# Patient Record
Sex: Male | Born: 1990 | Race: White | Hispanic: No | Smoking: Current every day smoker
Health system: Southern US, Community
[De-identification: ages and names within clinical notes are randomized; demographics above are authoritative.]

## PROBLEM LIST (undated history)

## (undated) DIAGNOSIS — S8290XA Unspecified fracture of unspecified lower leg, initial encounter for closed fracture: Secondary | ICD-10-CM

## (undated) HISTORY — PX: OTHER SURGICAL HISTORY: SHX169

## (undated) HISTORY — PX: WISDOM TOOTH EXTRACTION: SHX21

---
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
## 2010-02-02 NOTE — Letter (Signed)
Summary: Nadara Eaton letter  [facility] at [facility]  [address] [place], [place] [address]   Phone: [phone]  Fax: [phone]       08/09/2009 MRN: [id number]  DELANTE KARAPETYAN [address] [place], [place]  [address]  Dear Mr. OLESON,  [facility] and [facility] announce the retirement of Arta Silence, M.D., from full-time practice at the [facility] office effective July 01, 2009 and his plans of returning part-time.  It is important to Dr. Hetty Ely and to our practice that you understand that [facility] has seven physicians in our office for your health care needs.  We will continue to offer the same exceptional care that you have today.    Dr. Hetty Ely has spoken to many of you about his plans for retirement and returning part-time in the fall.   We will continue to work with you through the transition to schedule appointments for you in the office and meet the high standards that [facility] is committed to.   Again, it is with great pleasure that we share the news that Dr. Hetty Ely will return to [employer] at [facility] in October of 2011 with a reduced schedule.    If you have any questions, or would like to request an appointment with one of our physicians, please call us at [phone] and press the option for Scheduling an appointment.  We take pleasure in providing you with excellent patient care and look forward to seeing you at your next office visit.  Our [facility] Physicians are:  Tillman Abide, M.D. Laurita Quint, M.D. Roxy Manns, M.D. Kerby Nora, M.D. Hannah Beat, M.D. Ruthe Mannan, M.D. We proudly welcomed Raechel Ache, M.D. and Eustaquio Boyden, M.D. to the practice in July/August 2011.  Sincerely,  [facility]

## 2011-02-02 ENCOUNTER — Inpatient Hospital Stay: Payer: Self-pay | Admitting: Orthopedic Surgery

## 2011-02-02 LAB — CBC
HCT: 43.1 % (ref 40.0–52.0)
MCHC: 33.6 g/dL (ref 32.0–36.0)
MCV: 85 fL (ref 80–100)
Platelet: 287 10*3/uL (ref 150–440)
RBC: 5.05 10*6/uL (ref 4.40–5.90)
RDW: 14.2 % (ref 11.5–14.5)
WBC: 9.2 10*3/uL (ref 3.8–10.6)

## 2011-02-02 LAB — URINALYSIS, COMPLETE
Bacteria: NONE SEEN
Glucose,UR: NEGATIVE mg/dL (ref 0–75)
Ketone: NEGATIVE
Ph: 5 (ref 4.5–8.0)
Specific Gravity: 1.01 (ref 1.003–1.030)
WBC UR: 1 /HPF (ref 0–5)

## 2011-02-02 LAB — COMPREHENSIVE METABOLIC PANEL
Albumin: 4.1 g/dL (ref 3.4–5.0)
BUN: 14 mg/dL (ref 7–18)
Bilirubin,Total: 0.4 mg/dL (ref 0.2–1.0)
Chloride: 104 mmol/L (ref 98–107)
Creatinine: 0.85 mg/dL (ref 0.60–1.30)
EGFR (African American): >60
Glucose: 84 mg/dL (ref 65–99)
SGOT(AST): 20 U/L (ref 15–37)
SGPT (ALT): 24 U/L
Sodium: 141 mmol/L (ref 136–145)
Total Protein: 7.8 g/dL (ref 6.4–8.2)

## 2011-02-02 LAB — PROTIME-INR: INR: 1

## 2012-11-10 ENCOUNTER — Emergency Department: Payer: Self-pay | Admitting: Internal Medicine

## 2012-12-31 ENCOUNTER — Emergency Department: Payer: Self-pay | Admitting: Emergency Medicine

## 2013-02-28 ENCOUNTER — Emergency Department: Payer: Self-pay | Admitting: Emergency Medicine

## 2013-02-28 LAB — COMPREHENSIVE METABOLIC PANEL
ANION GAP: 9 (ref 7–16)
AST: 12 U/L — AB (ref 15–37)
Albumin: 3.5 g/dL (ref 3.4–5.0)
Alkaline Phosphatase: 80 U/L
BUN: 11 mg/dL (ref 7–18)
Bilirubin,Total: 0.4 mg/dL (ref 0.2–1.0)
CHLORIDE: 104 mmol/L (ref 98–107)
CREATININE: 0.97 mg/dL (ref 0.60–1.30)
Calcium, Total: 9.2 mg/dL (ref 8.5–10.1)
Co2: 26 mmol/L (ref 21–32)
Glucose: 92 mg/dL (ref 65–99)
OSMOLALITY: 277 (ref 275–301)
POTASSIUM: 3.9 mmol/L (ref 3.5–5.1)
SGPT (ALT): 27 U/L (ref 12–78)
Sodium: 139 mmol/L (ref 136–145)
TOTAL PROTEIN: 8.2 g/dL (ref 6.4–8.2)

## 2013-02-28 LAB — URINALYSIS, COMPLETE
Bacteria: NONE SEEN
Bilirubin,UR: NEGATIVE
Blood: NEGATIVE
GLUCOSE, UR: NEGATIVE mg/dL (ref 0–75)
Ketone: NEGATIVE
LEUKOCYTE ESTERASE: NEGATIVE
Nitrite: NEGATIVE
PH: 6 (ref 4.5–8.0)
PROTEIN: NEGATIVE
Specific Gravity: 1.013 (ref 1.003–1.030)
Squamous Epithelial: NONE SEEN
WBC UR: 2 /HPF (ref 0–5)

## 2013-02-28 LAB — CBC WITH DIFFERENTIAL/PLATELET
BASOS ABS: 0.1 10*3/uL (ref 0.0–0.1)
BASOS PCT: 0.7 %
EOS PCT: 1.4 %
Eosinophil #: 0.2 10*3/uL (ref 0.0–0.7)
HCT: 44.8 % (ref 40.0–52.0)
HGB: 14.7 g/dL (ref 13.0–18.0)
LYMPHS ABS: 2.2 10*3/uL (ref 1.0–3.6)
Lymphocyte %: 15 %
MCH: 27.9 pg (ref 26.0–34.0)
MCHC: 32.9 g/dL (ref 32.0–36.0)
MCV: 85 fL (ref 80–100)
MONO ABS: 1.6 x10 3/mm — AB (ref 0.2–1.0)
Monocyte %: 11.2 %
Neutrophil #: 10.3 10*3/uL — ABNORMAL HIGH (ref 1.4–6.5)
Neutrophil %: 71.7 %
PLATELETS: 325 10*3/uL (ref 150–440)
RBC: 5.28 10*6/uL (ref 4.40–5.90)
RDW: 14.6 % — ABNORMAL HIGH (ref 11.5–14.5)
WBC: 14.4 10*3/uL — ABNORMAL HIGH (ref 3.8–10.6)

## 2013-03-01 LAB — MONONUCLEOSIS SCREEN: Mono Test: POSITIVE

## 2013-08-29 ENCOUNTER — Emergency Department (HOSPITAL_COMMUNITY): Payer: No Typology Code available for payment source

## 2013-08-29 ENCOUNTER — Encounter (HOSPITAL_COMMUNITY): Payer: Self-pay | Admitting: Emergency Medicine

## 2013-08-29 ENCOUNTER — Emergency Department (HOSPITAL_COMMUNITY)
Admission: EM | Admit: 2013-08-29 | Discharge: 2013-08-29 | Disposition: A | Discharge status: Home / Self Care | Payer: No Typology Code available for payment source | Attending: Emergency Medicine | Admitting: Emergency Medicine

## 2013-08-29 DIAGNOSIS — IMO0002 Reserved for concepts with insufficient information to code with codable children: Secondary | ICD-10-CM | POA: Insufficient documentation

## 2013-08-29 DIAGNOSIS — S0990XA Unspecified injury of head, initial encounter: Secondary | ICD-10-CM | POA: Insufficient documentation

## 2013-08-29 DIAGNOSIS — S298XXA Other specified injuries of thorax, initial encounter: Secondary | ICD-10-CM | POA: Diagnosis present

## 2013-08-29 DIAGNOSIS — S301XXA Contusion of abdominal wall, initial encounter: Secondary | ICD-10-CM | POA: Diagnosis not present

## 2013-08-29 DIAGNOSIS — S20211A Contusion of right front wall of thorax, initial encounter: Secondary | ICD-10-CM

## 2013-08-29 DIAGNOSIS — S8012XA Contusion of left lower leg, initial encounter: Secondary | ICD-10-CM

## 2013-08-29 DIAGNOSIS — S20219A Contusion of unspecified front wall of thorax, initial encounter: Secondary | ICD-10-CM | POA: Diagnosis not present

## 2013-08-29 DIAGNOSIS — Y92410 Unspecified street and highway as the place of occurrence of the external cause: Secondary | ICD-10-CM | POA: Insufficient documentation

## 2013-08-29 DIAGNOSIS — S8010XA Contusion of unspecified lower leg, initial encounter: Secondary | ICD-10-CM | POA: Diagnosis not present

## 2013-08-29 DIAGNOSIS — Y9389 Activity, other specified: Secondary | ICD-10-CM | POA: Insufficient documentation

## 2013-08-29 HISTORY — DX: Unspecified fracture of unspecified lower leg, initial encounter for closed fracture: S82.90XA

## 2013-08-29 LAB — CBC WITH DIFFERENTIAL/PLATELET
BASOS ABS: 0.1 10*3/uL (ref 0.0–0.1)
BASOS PCT: 1 % (ref 0–1)
Eosinophils Absolute: 0.1 10*3/uL (ref 0.0–0.7)
Eosinophils Relative: 2 % (ref 0–5)
HEMATOCRIT: 44 % (ref 39.0–52.0)
Hemoglobin: 15.2 g/dL (ref 13.0–17.0)
Lymphocytes Relative: 31 % (ref 12–46)
Lymphs Abs: 2.3 10*3/uL (ref 0.7–4.0)
MCH: 28.4 pg (ref 26.0–34.0)
MCHC: 34.5 g/dL (ref 30.0–36.0)
MCV: 82.1 fL (ref 78.0–100.0)
MONO ABS: 0.6 10*3/uL (ref 0.1–1.0)
Monocytes Relative: 8 % (ref 3–12)
NEUTROS ABS: 4.4 10*3/uL (ref 1.7–7.7)
Neutrophils Relative %: 60 % (ref 43–77)
PLATELETS: 266 10*3/uL (ref 150–400)
RBC: 5.36 MIL/uL (ref 4.22–5.81)
RDW: 12.9 % (ref 11.5–15.5)
WBC: 7.4 10*3/uL (ref 4.0–10.5)

## 2013-08-29 LAB — COMPREHENSIVE METABOLIC PANEL
ALBUMIN: 3.9 g/dL (ref 3.5–5.2)
ALT: 16 U/L (ref 0–53)
AST: 17 U/L (ref 0–37)
Alkaline Phosphatase: 68 U/L (ref 39–117)
Anion gap: 18 — ABNORMAL HIGH (ref 5–15)
BUN: 12 mg/dL (ref 6–23)
CHLORIDE: 102 meq/L (ref 96–112)
CO2: 20 mEq/L (ref 19–32)
Calcium: 9.2 mg/dL (ref 8.4–10.5)
Creatinine, Ser: 1.06 mg/dL (ref 0.50–1.35)
GFR calc Af Amer: >90 mL/min (ref >90)
GFR calc non Af Amer: >90 mL/min (ref >90)
Glucose, Bld: 111 mg/dL — ABNORMAL HIGH (ref 70–99)
Potassium: 3.9 mEq/L (ref 3.7–5.3)
SODIUM: 140 meq/L (ref 137–147)
Total Bilirubin: 0.4 mg/dL (ref 0.3–1.2)
Total Protein: 7.5 g/dL (ref 6.0–8.3)

## 2013-08-29 LAB — I-STAT CHEM 8, ED
BUN: 12 mg/dL (ref 6–23)
CHLORIDE: 106 meq/L (ref 96–112)
CREATININE: 1.3 mg/dL (ref 0.50–1.35)
Calcium, Ion: 1.12 mmol/L (ref 1.12–1.23)
Glucose, Bld: 110 mg/dL — ABNORMAL HIGH (ref 70–99)
HCT: 49 % (ref 39.0–52.0)
Hemoglobin: 16.7 g/dL (ref 13.0–17.0)
Potassium: 3.7 mEq/L (ref 3.7–5.3)
SODIUM: 140 meq/L (ref 137–147)
TCO2: 22 mmol/L (ref 0–100)

## 2013-08-29 LAB — TROPONIN I: Troponin I: <0.3 ng/mL (ref <0.30)

## 2013-08-29 MED ORDER — HYDROMORPHONE HCL PF 1 MG/ML IJ SOLN
1.0000 mg | Freq: Once | INTRAMUSCULAR | Status: AC
  Administered 2013-08-29: 1 mg via INTRAVENOUS
  Filled 2013-08-29: qty 1

## 2013-08-29 MED ORDER — HYDROCODONE-ACETAMINOPHEN 5-325 MG PO TABS: 2.0000 | ORAL_TABLET | ORAL | Status: AC | PRN

## 2013-08-29 MED ORDER — ONDANSETRON HCL 4 MG/2ML IJ SOLN
4.0000 mg | Freq: Once | INTRAMUSCULAR | Status: AC
  Administered 2013-08-29: 4 mg via INTRAVENOUS
  Filled 2013-08-29: qty 2

## 2013-08-29 MED ORDER — CYCLOBENZAPRINE HCL 10 MG PO TABS: 10.0000 mg | ORAL_TABLET | Freq: Three times a day (TID) | ORAL | Status: AC | PRN

## 2013-08-29 MED ORDER — IBUPROFEN 800 MG PO TABS: 800.0000 mg | ORAL_TABLET | Freq: Three times a day (TID) | ORAL | Status: AC

## 2013-08-29 MED ORDER — IOHEXOL 300 MG/ML  SOLN
100.0000 mL | Freq: Once | INTRAMUSCULAR | Status: AC | PRN
  Administered 2013-08-29: 100 mL via INTRAVENOUS

## 2013-08-29 NOTE — ED Notes (Signed)
Discussed plan of care with Dr. Blinda Leatherwood, will prepare the patient for discharge.

## 2013-08-29 NOTE — ED Notes (Signed)
Reminded patient that he needs to submit urine sample.
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
## 2013-08-29 NOTE — ED Notes (Signed)
Dr. Pollina at bedside   

## 2013-12-23 ENCOUNTER — Emergency Department: Payer: Self-pay | Admitting: Emergency Medicine
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
## 2014-04-26 NOTE — Op Note (Signed)
PATIENT NAME:  Jimmy Oconnell, Jimmy Oconnell MR#:  [id number] DATE OF BIRTH:  February 25, 1990  DATE OF PROCEDURE:  02/03/2011  PREOPERATIVE DIAGNOSIS: Left distal tibia and fibula fracture.   POSTOPERATIVE DIAGNOSIS: Left distal tibia and fibula fracture.   PROCEDURE: Closed reduction with long-leg casting of left lower leg.   SURGEON: Leitha SchullerMichael J. Jakalyn Kratky, M.D.   ANESTHESIA: General.   DESCRIPTION OF PROCEDURE: The patient was brought to the Operating Room and, after adequate anesthesia was obtained, the C-arm was brought in and alignment checked in AP and lateral projections. There was slight valgus deformity and this could be corrected with slight varus pressure. The distal fragment was slightly posterior and this also could be at least partially corrected. Cast padding was then applied and a short-leg cast placed with the leg in flexion contouring the cast to maintain near anatomic alignment. C-arm was used to check alignment at this point. After the short-leg cast was set, the cast was extended to above the knee with about 35 degrees of knee flexion to maintain rotational stability. After the cast had set, the patient was woken up and sent to the Recovery Room.   C-arm views were obtained during the procedure with permanent postoperative x-ray showing acceptable alignment.  ESTIMATED BLOOD LOSS: None.  COMPLICATIONS: None.  SPECIMENS: None.  ____________________________ Leitha SchullerMichael J. Marti Acebo, MD mjm:[id number] D: 02/03/2011 21:47:08 ET     T: 02/04/2011 11:51:20 ET        JOB#: [id number] Nolon BussingMICHAEL J Wildon Cuevas MD ELECTRONICALLY SIGNED 02/05/2011 7:47
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
## 2015-06-06 ENCOUNTER — Emergency Department
Admission: EM | Admit: 2015-06-06 | Discharge: 2015-06-06 | Disposition: A | Discharge status: Home / Self Care | Payer: Self-pay | Attending: Emergency Medicine | Admitting: Emergency Medicine

## 2015-06-06 ENCOUNTER — Encounter: Payer: Self-pay | Admitting: Emergency Medicine

## 2015-06-06 ENCOUNTER — Emergency Department: Payer: Self-pay

## 2015-06-06 DIAGNOSIS — S0093XA Contusion of unspecified part of head, initial encounter: Secondary | ICD-10-CM

## 2015-06-06 DIAGNOSIS — S060X9A Concussion with loss of consciousness of unspecified duration, initial encounter: Secondary | ICD-10-CM

## 2015-06-06 DIAGNOSIS — S0990XA Unspecified injury of head, initial encounter: Secondary | ICD-10-CM

## 2015-06-06 DIAGNOSIS — Y999 Unspecified external cause status: Secondary | ICD-10-CM | POA: Insufficient documentation

## 2015-06-06 DIAGNOSIS — F17210 Nicotine dependence, cigarettes, uncomplicated: Secondary | ICD-10-CM | POA: Insufficient documentation

## 2015-06-06 DIAGNOSIS — Y929 Unspecified place or not applicable: Secondary | ICD-10-CM | POA: Insufficient documentation

## 2015-06-06 DIAGNOSIS — Y9389 Activity, other specified: Secondary | ICD-10-CM | POA: Insufficient documentation

## 2015-06-06 DIAGNOSIS — F1010 Alcohol abuse, uncomplicated: Secondary | ICD-10-CM

## 2015-06-06 LAB — URINE DRUG SCREEN, QUALITATIVE (ARMC ONLY)
Amphetamines, Ur Screen: NOT DETECTED
BARBITURATES, UR SCREEN: NOT DETECTED
Benzodiazepine, Ur Scrn: NOT DETECTED
COCAINE METABOLITE, UR ~~LOC~~: POSITIVE — AB
Cannabinoid 50 Ng, Ur ~~LOC~~: POSITIVE — AB
MDMA (ECSTASY) UR SCREEN: NOT DETECTED
METHADONE SCREEN, URINE: NOT DETECTED
Opiate, Ur Screen: NOT DETECTED
Phencyclidine (PCP) Ur S: NOT DETECTED
Tricyclic, Ur Screen: NOT DETECTED

## 2015-06-06 LAB — ETHANOL: Alcohol, Ethyl (B): 77 mg/dL — ABNORMAL HIGH (ref <5)
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
## 2015-06-06 NOTE — Discharge Instructions (Signed)
Concussion, Adult °A concussion, or closed-head injury, is a brain injury caused by a direct blow to the head or by a quick and sudden movement (jolt) of the head or neck. Concussions are usually not life-threatening. Even so, the effects of a concussion can be serious. If you have had a concussion before, you are more likely to experience concussion-like symptoms after a direct blow to the head.  °CAUSES °· Direct blow to the head, such as from running into another player during a soccer game, being hit in a fight, or hitting your head on a hard surface. °· A jolt of the head or neck that causes the brain to move back and forth inside the skull, such as in a car crash. °SIGNS AND SYMPTOMS °The signs of a concussion can be hard to notice. Early on, they may be missed by you, family members, and health care providers. You may look fine but act or feel differently. °Symptoms are usually temporary, but they may last for days, weeks, or even longer. Some symptoms may appear right away while others may not show up for hours or days. Every head injury is different. Symptoms include: °· Mild to moderate headaches that will not go away. °· A feeling of pressure inside your head. °· Having more trouble than usual: °¨ Learning or remembering things you have heard. °¨ Answering questions. °¨ Paying attention or concentrating. °¨ Organizing daily tasks. °¨ Making decisions and solving problems. °· Slowness in thinking, acting or reacting, speaking, or reading. °· Getting lost or being easily confused. °· Feeling tired all the time or lacking energy (fatigued). °· Feeling drowsy. °· Sleep disturbances. °¨ Sleeping more than usual. °¨ Sleeping less than usual. °¨ Trouble falling asleep. °¨ Trouble sleeping (insomnia). °· Loss of balance or feeling lightheaded or dizzy. °· Nausea or vomiting. °· Numbness or tingling. °· Increased sensitivity to: °¨ Sounds. °¨ Lights. °¨ Distractions. °· Vision problems or eyes that tire  easily. °· Diminished sense of taste or smell. °· Ringing in the ears. °· Mood changes such as feeling sad or anxious. °· Becoming easily irritated or angry for little or no reason. °· Lack of motivation. °· Seeing or hearing things other people do not see or hear (hallucinations). °DIAGNOSIS °Your health care provider can usually diagnose a concussion based on a description of your injury and symptoms. He or she will ask whether you passed out (lost consciousness) and whether you are having trouble remembering events that happened right before and during your injury. °Your evaluation might include: °· A brain scan to look for signs of injury to the brain. Even if the test shows no injury, you may still have a concussion. °· Blood tests to be sure other problems are not present. °TREATMENT °· Concussions are usually treated in an emergency department, in urgent care, or at a clinic. You may need to stay in the hospital overnight for further treatment. °· Tell your health care provider if you are taking any medicines, including prescription medicines, over-the-counter medicines, and natural remedies. Some medicines, such as blood thinners (anticoagulants) and aspirin, may increase the chance of complications. Also tell your health care provider whether you have had alcohol or are taking illegal drugs. This information may affect treatment. °· Your health care provider will send you home with important instructions to follow. °· How fast you will recover from a concussion depends on many factors. These factors include how severe your concussion is, what part of your brain was injured,   your age, and how healthy you were before the concussion.  Most people with mild injuries recover fully. Recovery can take time. In general, recovery is slower in older persons. Also, persons who have had a concussion in the past or have other medical problems may find that it takes longer to recover from their current injury. HOME  CARE INSTRUCTIONS General Instructions  Carefully follow the directions your health care provider gave you.  Only take over-the-counter or prescription medicines for pain, discomfort, or fever as directed by your health care provider.  Take only those medicines that your health care provider has approved.  Do not drink alcohol until your health care provider says you are well enough to do so. Alcohol and certain other drugs may slow your recovery and can put you at risk of further injury.  If it is harder than usual to remember things, write them down.  If you are easily distracted, try to do one thing at a time. For example, do not try to watch TV while fixing dinner.  Talk with family members or close friends when making important decisions.  Keep all follow-up appointments. Repeated evaluation of your symptoms is recommended for your recovery.  Watch your symptoms and tell others to do the same. Complications sometimes occur after a concussion. Older adults with a brain injury may have a higher risk of serious complications, such as a blood clot on the brain.  Tell your teachers, school nurse, school counselor, coach, athletic trainer, or work Freight forwarder about your injury, symptoms, and restrictions. Tell them about what you can or cannot do. They should watch for:  Increased problems with attention or concentration.  Increased difficulty remembering or learning new information.  Increased time needed to complete tasks or assignments.  Increased irritability or decreased ability to cope with stress.  Increased symptoms.  Rest. Rest helps the brain to heal. Make sure you:  Get plenty of sleep at night. Avoid staying up late at night.  Keep the same bedtime hours on weekends and weekdays.  Rest during the day. Take daytime naps or rest breaks when you feel tired.  Limit activities that require a lot of thought or concentration. These include:  Doing homework or job-related  work.  Watching TV.  Working on the computer.  Avoid any situation where there is potential for another head injury (football, hockey, soccer, basketball, martial arts, downhill snow sports and horseback riding). Your condition will get worse every time you experience a concussion. You should avoid these activities until you are evaluated by the appropriate follow-up health care providers. Returning To Your Regular Activities You will need to return to your normal activities slowly, not all at once. You must give your body and brain enough time for recovery.  Do not return to sports or other athletic activities until your health care provider tells you it is safe to do so.  Ask your health care provider when you can drive, ride a bicycle, or operate heavy machinery. Your ability to react may be slower after a brain injury. Never do these activities if you are dizzy.  Ask your health care provider about when you can return to work or school. Preventing Another Concussion It is very important to avoid another brain injury, especially before you have recovered. In rare cases, another injury can lead to permanent brain damage, brain swelling, or death. The risk of this is greatest during the first 7-10 days after a head injury. Avoid injuries by:  Wearing a  seat belt when riding in a car.  Drinking alcohol only in moderation.  Wearing a helmet when biking, skiing, skateboarding, skating, or doing similar activities.  Avoiding activities that could lead to a second concussion, such as contact or recreational sports, until your health care provider says it is okay.  Taking safety measures in your home.  Remove clutter and tripping hazards from floors and stairways.  Use grab bars in bathrooms and handrails by stairs.  Place non-slip mats on floors and in bathtubs.  Improve lighting in dim areas. SEEK MEDICAL CARE IF:  You have increased problems paying attention or  concentrating.  You have increased difficulty remembering or learning new information.  You need more time to complete tasks or assignments than before.  You have increased irritability or decreased ability to cope with stress.  You have more symptoms than before. Seek medical care if you have any of the following symptoms for more than 2 weeks after your injury:  Lasting (chronic) headaches.  Dizziness or balance problems.  Nausea.  Vision problems.  Increased sensitivity to noise or light.  Depression or mood swings.  Anxiety or irritability.  Memory problems.  Difficulty concentrating or paying attention.  Sleep problems.  Feeling tired all the time. SEEK IMMEDIATE MEDICAL CARE IF:  You have severe or worsening headaches. These may be a sign of a blood clot in the brain.  You have weakness (even if only in one hand, leg, or part of the face).  You have numbness.  You have decreased coordination.  You vomit repeatedly.  You have increased sleepiness.  One pupil is larger than the other.  You have convulsions.  You have slurred speech.  You have increased confusion. This may be a sign of a blood clot in the brain.  You have increased restlessness, agitation, or irritability.  You are unable to recognize people or places.  You have neck pain.  It is difficult to wake you up.  You have unusual behavior changes.  You lose consciousness. MAKE SURE YOU:  Understand these instructions.  Will watch your condition.  Will get help right away if you are not doing well or get worse.   This information is not intended to replace advice given to you by your health care provider. Make sure you discuss any questions you have with your health care provider.   Document Released: 03/11/2003 Document Revised: 01/09/2014 Document Reviewed: 07/11/2012 Elsevier Interactive Patient Education 2016 [employer]  Cryotherapy Cryotherapy is when you put ice on  your injury. Ice helps lessen pain and puffiness (swelling) after an injury. Ice works the best when you start using it in the first 24 to 48 hours after an injury. HOME CARE  Put a dry or damp towel between the ice pack and your skin.  You may press gently on the ice pack.  Leave the ice on for no more than 10 to 20 minutes at a time.  Check your skin after 5 minutes to make sure your skin is okay.  Rest at least 20 minutes between ice pack uses.  Stop using ice when your skin loses feeling (numbness).  Do not use ice on someone who cannot tell you when it hurts. This includes small children and people with memory problems (dementia). GET HELP RIGHT AWAY IF:  You have white spots on your skin.  Your skin turns blue or pale.  Your skin feels waxy or hard.  Your puffiness gets worse. MAKE SURE YOU:  Understand these instructions.  Will watch your condition.  Will get help right away if you are not doing well or get worse.   This information is not intended to replace advice given to you by your health care provider. Make sure you discuss any questions you have with your health care provider.   Document Released: 06/07/2007 Document Revised: 03/13/2011 Document Reviewed: 08/11/2010 Elsevier Interactive Patient Education 2016 [employer]  Head Injury, Adult You have received a head injury. It does not appear serious at this time. Headaches and vomiting are common following head injury. It should be easy to awaken from sleeping. Sometimes it is necessary for you to stay in the emergency department for a while for observation. Sometimes admission to the hospital may be needed. After injuries such as yours, most problems occur within the first 24 hours, but side effects may occur up to 7-10 days after the injury. It is important for you to carefully monitor your condition and contact your health care provider or seek immediate medical care if there is a change in your  condition. WHAT ARE THE TYPES OF HEAD INJURIES? Head injuries can be as minor as a bump. Some head injuries can be more severe. More severe head injuries include:  A jarring injury to the brain (concussion).  A bruise of the brain (contusion). This mean there is bleeding in the brain that can cause swelling.  A cracked skull (skull fracture).  Bleeding in the brain that collects, clots, and forms a bump (hematoma). WHAT CAUSES A HEAD INJURY? A serious head injury is most likely to happen to someone who is in a car wreck and is not wearing a seat belt. Other causes of major head injuries include bicycle or motorcycle accidents, sports injuries, and falls. HOW ARE HEAD INJURIES DIAGNOSED? A complete history of the event leading to the injury and your current symptoms will be helpful in diagnosing head injuries. Many times, pictures of the brain, such as CT or MRI are needed to see the extent of the injury. Often, an overnight hospital stay is necessary for observation.  WHEN SHOULD I SEEK IMMEDIATE MEDICAL CARE?  You should get help right away if:  You have confusion or drowsiness.  You feel sick to your stomach (nauseous) or have continued, forceful vomiting.  You have dizziness or unsteadiness that is getting worse.  You have severe, continued headaches not relieved by medicine. Only take over-the-counter or prescription medicines for pain, fever, or discomfort as directed by your health care provider.  You do not have normal function of the arms or legs or are unable to walk.  You notice changes in the black spots in the center of the colored part of your eye (pupil).  You have a clear or bloody fluid coming from your nose or ears.  You have a loss of vision. During the next 24 hours after the injury, you must stay with someone who can watch you for the warning signs. This person should contact local emergency services (911 in the [place] if you have seizures, you become unconscious,  or you are unable to wake up. HOW CAN I PREVENT A HEAD INJURY IN THE FUTURE? The most important factor for preventing major head injuries is avoiding motor vehicle accidents. To minimize the potential for damage to your head, it is crucial to wear seat belts while riding in motor vehicles. Wearing helmets while bike riding and playing collision sports (like football) is also helpful. Also, avoiding dangerous activities around  the house will further help reduce your risk of head injury.  WHEN CAN I RETURN TO NORMAL ACTIVITIES AND ATHLETICS? You should be reevaluated by your health care provider before returning to these activities. If you have any of the following symptoms, you should not return to activities or contact sports until 1 week after the symptoms have stopped:  Persistent headache.  Dizziness or vertigo.  Poor attention and concentration.  Confusion.  Memory problems.  Nausea or vomiting.  Fatigue or tire easily.  Irritability.  Intolerant of bright lights or loud noises.  Anxiety or depression.  Disturbed sleep. MAKE SURE YOU:   Understand these instructions.  Will watch your condition.  Will get help right away if you are not doing well or get worse.   This information is not intended to replace advice given to you by your health care provider. Make sure you discuss any questions you have with your health care provider.   Document Released: 12/19/2004 Document Revised: 01/09/2014 Document Reviewed: 08/26/2012 Elsevier Interactive Patient Education 2016 [employer]  Post-Concussion Syndrome Post-concussion syndrome describes the symptoms that can occur after a head injury. These symptoms can last from weeks to months. CAUSES  It is not clear why some head injuries cause post-concussion syndrome. It can occur whether your head injury was mild or severe and whether you were wearing head protection or not.  SIGNS AND SYMPTOMS  Memory  difficulties.  Dizziness.  Headaches.  Double vision or blurry vision.  Sensitivity to light.  Hearing difficulties.  Depression.  Tiredness.  Weakness.  Difficulty with concentration.  Difficulty sleeping or staying asleep.  Vomiting.  Poor balance or instability on your feet.  Slow reaction time.  Difficulty learning and remembering things you have heard. DIAGNOSIS  There is no test to determine whether you have post-concussion syndrome. Your health care provider may order an imaging scan of your brain, such as a CT scan, to check for other problems that may be causing your symptoms (such as a severe injury inside your skull). TREATMENT  Usually, these problems disappear over time without medical care. Your health care provider may prescribe medicine to help ease your symptoms. It is important to follow up with a neurologist to evaluate your recovery and address any lingering symptoms or issues. HOME CARE INSTRUCTIONS   Take medicines only as directed by your health care provider. Do not take aspirin. Aspirin can slow blood clotting.  Sleep with your head slightly elevated to help with headaches.  Avoid any situation where there is potential for another head injury. This includes football, hockey, soccer, basketball, martial arts, downhill snow sports, and horseback riding. Your condition will get worse every time you experience a concussion. You should avoid these activities until you are evaluated by the appropriate follow-up health care providers.  Keep all follow-up visits as directed by your health care provider. This is important. SEEK MEDICAL CARE IF:  You have increased problems paying attention or concentrating.  You have increased difficulty remembering or learning new information.  You need more time to complete tasks or assignments than before.  You have increased irritability or decreased ability to cope with stress.  You have more symptoms than  before. Seek medical care if you have any of the following symptoms for more than two weeks after your injury:  Lasting (chronic) headaches.  Dizziness or balance problems.  Nausea.  Vision problems.  Increased sensitivity to noise or light.  Depression or mood swings.  Anxiety or irritability.  Memory problems.  Difficulty concentrating or paying attention.  Sleep problems.  Feeling tired all the time. SEEK IMMEDIATE MEDICAL CARE IF:  You have confusion or unusual drowsiness.  Others find it difficult to wake you up.  You have nausea or persistent, forceful vomiting.  You feel like you are moving when you are not (vertigo). Your eyes may move rapidly back and forth.  You have convulsions or faint.  You have severe, persistent headaches that are not relieved by medicine.  You cannot use your arms or legs normally.  One of your pupils is larger than the other.  You have clear or bloody discharge from your nose or ears.  Your problems are getting worse, not better. MAKE SURE YOU:  Understand these instructions.  Will watch your condition.  Will get help right away if you are not doing well or get worse.   This information is not intended to replace advice given to you by your health care provider. Make sure you discuss any questions you have with your health care provider.   Document Released: 06/10/2001 Document Revised: 01/09/2014 Document Reviewed: 03/26/2013 Elsevier Interactive Patient Education 2016 [employer].  Polysubstance Abuse When people abuse more than one drug or type of drug it is called polysubstance or polydrug abuse. For example, many smokers also drink alcohol. This is one form of polydrug abuse. Polydrug abuse also refers to the use of a drug to counteract an unpleasant effect produced by another drug. It may also be used to help with withdrawal from another drug. People who take stimulants may become agitated. Sometimes this agitation  is countered with a tranquilizer. This helps protect against the unpleasant side effects. Polydrug abuse also refers to the use of different drugs at the same time.  Anytime drug use is interfering with normal living activities, it has become abuse. This includes problems with family and friends. Psychological dependence has developed when your mind tells you that the drug is needed. This is usually followed by physical dependence which has developed when continuing increases of drug are required to get the same feeling or "high". This is known as addiction or chemical dependency. A person's risk is much higher if there is a history of chemical dependency in the family. SIGNS OF CHEMICAL DEPENDENCY  You have been told by friends or family that drugs have become a problem.  You fight when using drugs.  You are having blackouts (not remembering what you do while using).  You feel sick from using drugs but continue using.  You lie about use or amounts of drugs (chemicals) used.  You need chemicals to get you going.  You are suffering in work performance or in school because of drug use.  You get sick from use of drugs but continue to use anyway.  You need drugs to relate to people or feel comfortable in social situations.  You use drugs to forget problems. "Yes" answered to any of the above signs of chemical dependency indicates there are problems. The longer the use of drugs continues, the greater the problems will become. If there is a family history of drug or alcohol use, it is best not to experiment with these drugs. Continual use leads to tolerance. After tolerance develops more of the drug is needed to get the same feeling. This is followed by addiction. With addiction, drugs become the most important part of life. It becomes more important to take drugs than participate in the other usual activities of life. This  includes relating to friends and family. Addiction is followed by  dependency. Dependency is a condition where drugs are now needed not just to get high, but to feel normal. Addiction cannot be cured but it can be stopped. This often requires outside help and the care of professionals. Treatment centers are listed in the yellow pages under: Cocaine, Narcotics, and Alcoholics Anonymous. Most hospitals and clinics can refer you to a specialized care center. Talk to your caregiver if you need help.   This information is not intended to replace advice given to you by your health care provider. Make sure you discuss any questions you have with your health care provider.   Document Released: 08/10/2004 Document Revised: 03/13/2011 Document Reviewed: 12/24/2013 Elsevier Interactive Patient Education 2016 [employer]  Facial or Scalp Contusion A facial or scalp contusion is a deep bruise on the face or head. Injuries to the face and head generally cause a lot of swelling, especially around the eyes. Contusions are the result of an injury that caused bleeding under the skin. The contusion may turn blue, purple, or yellow. Minor injuries will give you a painless contusion, but more severe contusions may stay painful and swollen for a few weeks.  CAUSES  A facial or scalp contusion is caused by a blunt injury or trauma to the face or head area.  SIGNS AND SYMPTOMS   Swelling of the injured area.   Discoloration of the injured area.   Tenderness, soreness, or pain in the injured area.  DIAGNOSIS  The diagnosis can be made by taking a medical history and doing a physical exam. An X-ray exam, CT scan, or MRI may be needed to determine if there are any associated injuries, such as broken bones (fractures). TREATMENT  Often, the best treatment for a facial or scalp contusion is applying cold compresses to the injured area. Over-the-counter medicines may also be recommended for pain control.  HOME CARE INSTRUCTIONS   Only take over-the-counter or prescription  medicines as directed by your health care provider.   Apply ice to the injured area.   Put ice in a plastic bag.   Place a towel between your skin and the bag.   Leave the ice on for 20 minutes, 2-3 times a day.  SEEK MEDICAL CARE IF:  You have bite problems.   You have pain with chewing.   You are concerned about facial defects. SEEK IMMEDIATE MEDICAL CARE IF:  You have severe pain or a headache that is not relieved by medicine.   You have unusual sleepiness, confusion, or personality changes.   You throw up (vomit).   You have a persistent nosebleed.   You have double vision or blurred vision.   You have fluid drainage from your nose or ear.   You have difficulty walking or using your arms or legs.  MAKE SURE YOU:   Understand these instructions.  Will watch your condition.  Will get help right away if you are not doing well or get worse.   This information is not intended to replace advice given to you by your health care provider. Make sure you discuss any questions you have with your health care provider.   Document Released: 01/27/2004 Document Revised: 01/09/2014 Document Reviewed: 08/01/2012 Elsevier Interactive Patient Education [employer]
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
## 2017-12-13 ENCOUNTER — Emergency Department
Admission: EM | Admit: 2017-12-13 | Discharge: 2017-12-14 | Discharge status: Against Medical Advice | Payer: Self-pay | Attending: Emergency Medicine | Admitting: Emergency Medicine

## 2017-12-13 ENCOUNTER — Other Ambulatory Visit: Payer: Self-pay

## 2017-12-13 DIAGNOSIS — T402X1A Poisoning by other opioids, accidental (unintentional), initial encounter: Secondary | ICD-10-CM | POA: Insufficient documentation

## 2017-12-13 DIAGNOSIS — T40601A Poisoning by unspecified narcotics, accidental (unintentional), initial encounter: Secondary | ICD-10-CM

## 2017-12-13 DIAGNOSIS — F17210 Nicotine dependence, cigarettes, uncomplicated: Secondary | ICD-10-CM | POA: Insufficient documentation

## 2017-12-13 NOTE — ED Provider Notes (Addendum)
[facility] Emergency Department Provider Note       Time seen: ----------------------------------------- 10:20 PM on 12/13/2017 -----------------------------------------   I have reviewed the triage vital signs and the nursing notes.  HISTORY   Chief Complaint Drug Overdose    HPI LEODIS ALCOCER is a 27 y.o. male with no significant past medical history who presents to the ED for drug overdose.  Patient was found unresponsive in the fetal position with diminished respirations.  Patient was given Narcan in the prehospital setting with reversal of his unconscious state.  He arrives refusing further care.  Past Medical History:  Diagnosis Date  . Broken leg     Patient Active Problem List   Diagnosis Date Noted  . TOBACCO USER 02/06/2007    Past Surgical History:  Procedure Laterality Date  . right leg surgery Right   . WISDOM TOOTH EXTRACTION      Allergies Cefazolin and Cefprozil  Social History Social History   Tobacco Use  . Smoking status: Current Every Day Smoker    Packs/day: 3.00    Years: 8.00    Pack years: 24.00    Types: Cigarettes  Substance Use Topics  . Alcohol use: Yes    Comment: 25 oz beer  . Drug use: Yes    Types: Marijuana   Review of Systems Unknown, patient denies complaints but is belligerent  All systems negative/normal/unremarkable except as stated in the HPI  ____________________________________________   PHYSICAL EXAM:  VITAL SIGNS: ED Triage Vitals  Enc Vitals Group     BP      Pulse      Resp      Temp      Temp src      SpO2      Weight      Height      Head Circumference      Peak Flow      Pain Score      Pain Loc      Pain Edu?      Excl. in GC?    Constitutional: Alert, no acute distress Eyes: Conjunctivae are normal. Normal extraocular movements. ENT   Head: Normocephalic and atraumatic.   Nose: No congestion/rhinnorhea.   Mouth/Throat: Mucous membranes are  moist.   Neck: No stridor. Cardiovascular: Normal rate, regular rhythm. No murmurs, rubs, or gallops. Respiratory: Normal respiratory effort without tachypnea nor retractions. Breath sounds are clear and equal bilaterally. No wheezes/rales/rhonchi. Gastrointestinal: Soft and nontender. Normal bowel sounds Musculoskeletal: Nontender with normal range of motion in extremities. No lower extremity tenderness nor edema. Neurologic:  Normal speech and language. No gross focal neurologic deficits are appreciated.  Skin:  Skin is warm, dry and intact. No rash noted. Psychiatric: Somewhat hostile mood ____________________________________________  EKG: Interpreted by me.  Sinus rhythm rate 98 bpm, wide QRS, normal axis  ____________________________________________  ED COURSE:  As part of my medical decision making, I reviewed the following data within the electronic MEDICAL RECORD NUMBER History obtained from family if available, nursing notes, old chart and ekg, as well as notes from prior ED visits. Patient presented for overdose, we will assess with labs as indicated at this time.   Procedures ____________________________________________   LABS (pertinent positives/negatives)  Labs Reviewed  CBC WITH DIFFERENTIAL/PLATELET  COMPREHENSIVE METABOLIC PANEL  URINE DRUG SCREEN, QUALITATIVE ([facility] ONLY)  ETHANOL   ____________________________________________  DIFFERENTIAL DIAGNOSIS   Overdose, polysubstance abuse  FINAL ASSESSMENT AND PLAN  Drug overdose   Plan: The patient  had presented for narcotic overdose, he denies that he was trying to harm himself. Patient's refused all blood work and further care.  Patient has declined any further testing AGAINST MEDICAL ADVICE.   Ulice DashJohnathan E , MD   Note: This note was generated in part or whole with voice recognition software. Voice recognition is usually quite accurate but there are transcription errors that can and very often do  occur. I apologize for any typographical errors that were not detected and corrected.     Emily Filbert,  E, MD 12/13/17 [id number]    Emily Filbert,  E, MD 12/13/17 [phone]
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
## 2017-12-14 ENCOUNTER — Telehealth: Payer: Self-pay | Admitting: Family Medicine

## 2017-12-14 NOTE — Telephone Encounter (Signed)
Left detailed message on voicemail.  

## 2017-12-14 NOTE — ED Notes (Signed)
Patient refused all medical care.

## 2017-12-14 NOTE — ED Notes (Signed)
Reviewed discharge paperwork with patient. Patient states understanding of paperwork and signed discharge. Patient denies pain or withdraw symptoms. This Clinical research associatewriter educated patient on overdose.

## 2017-12-14 NOTE — Telephone Encounter (Signed)
Please try to call pt.  ER note re: drug overdose.  I am not the PCP- he hasn't been seen here in years- but offer follow up. Thanks.

## 2021-01-05 ENCOUNTER — Ambulatory Visit: Payer: Self-pay
